# Patient Record
Sex: Female | Born: 1985 | Race: White | Hispanic: No | Marital: Married
Health system: Midwestern US, Community
[De-identification: ages and names within clinical notes are randomized; demographics above are authoritative.]

---
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
## 2019-12-18 IMAGING — MR MRI CERVICAL SPINE WITHOUT CONTRAST
7 of 8 series · 36 of 48 positions shown · non-contrast
Comparison: none

﻿

MAGNETIC RESONANCE IMAGING CERVICAL SPINE WITHOUT CONTRAST ADMINISTRATION
HISTORY: Neck pain.  Left cervical radiculopathy.
TECHNIQUE: Multiplanar magnetic resonance imaging of the cervical spine was accomplished utilizing a surface coil and an open 0.3Adela Bent scanner without intravenous contrast administration.  T1 and T2-weighted thin slice sections were obtained.

[Series 1: scano s/c · axial · 5.0mm · 1.02mm/px · z∈[-8,+130]mm · 5 of 14 slices shown]
[im 1/14]
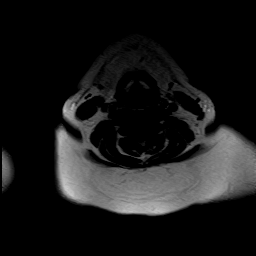
[im 4/14]
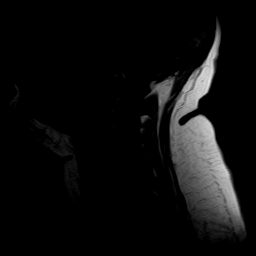
[im 7/14]
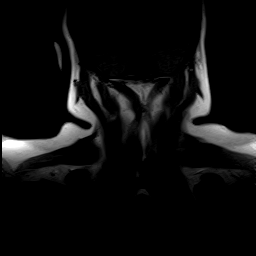
[im 10/14]
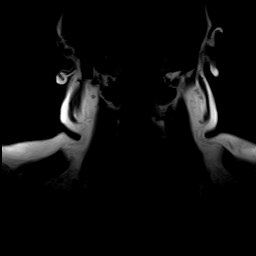
[im 14/14]
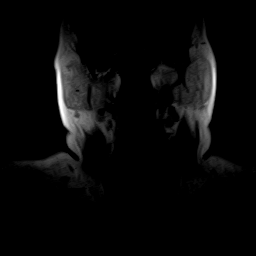

[Series 2: sag fir · sagittal · 3.0mm · 0.94mm/px · 4 of 13 slices shown]
[im 1/13]
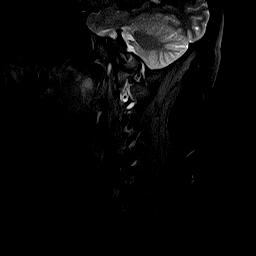
[im 5/13]
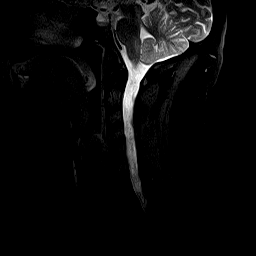
[im 9/13]
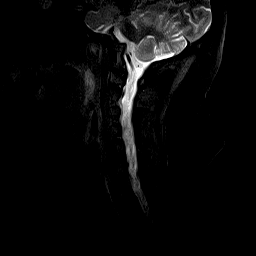
[im 13/13]
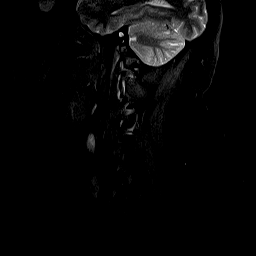

[Series 3: T1 · sagittal · 3.0mm · 0.94mm/px · 4 of 13 slices shown]
[im 1/13]
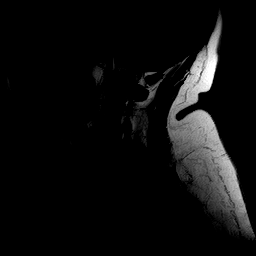
[im 5/13]
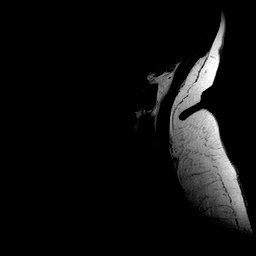
[im 9/13]
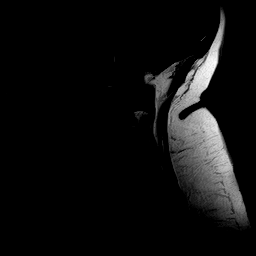
[im 13/13]
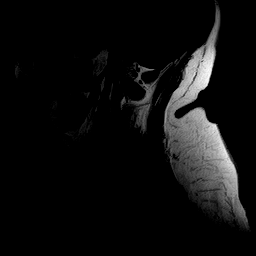

[Series 4: trs ge w/mtc · axial · 3.0mm · 0.94mm/px · z∈[-96,+4]mm · 9 of 26 slices shown]
[im 1/26]
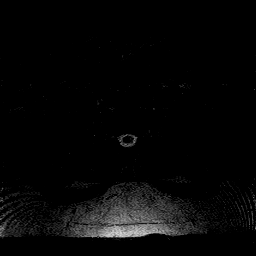
[im 4/26]
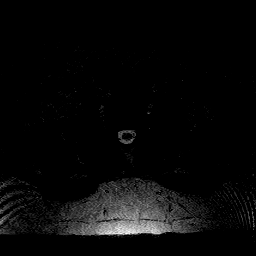
[im 7/26]
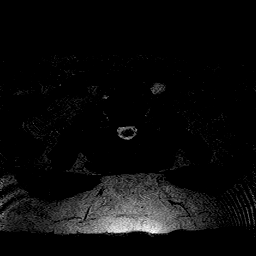
[im 10/26]
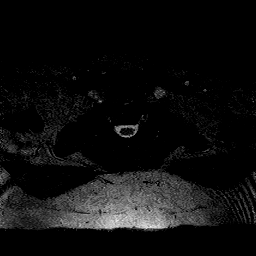
[im 13/26]
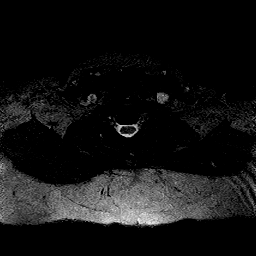
[im 16/26]
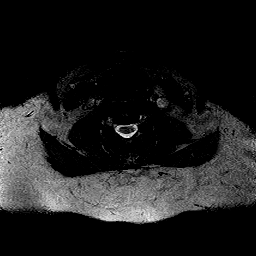
[im 19/26]
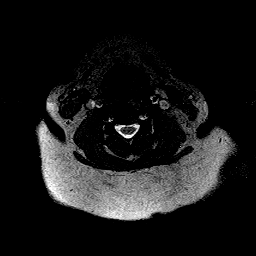
[im 22/26]
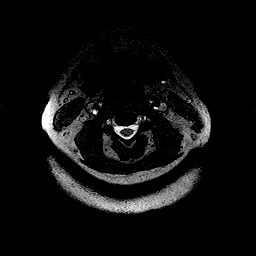
[im 26/26]
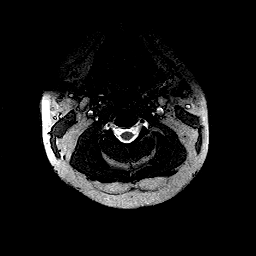

[Series 5: (id) · axial · 3.0mm · 0.94mm/px · z∈[-96,+4]mm · 9 of 26 slices shown]
[im 1/26]
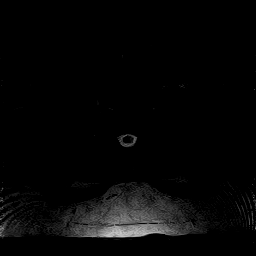
[im 4/26]
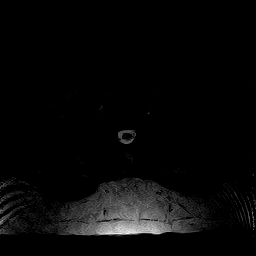
[im 7/26]
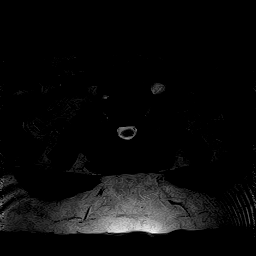
[im 10/26]
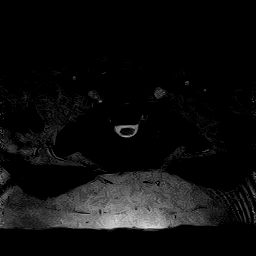
[im 13/26]
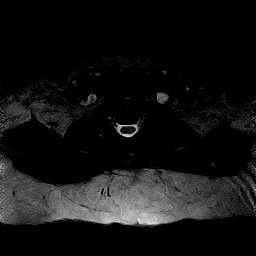
[im 16/26]
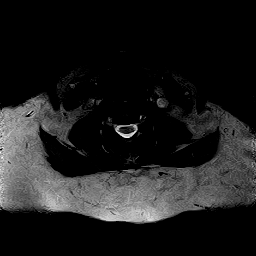
[im 19/26]
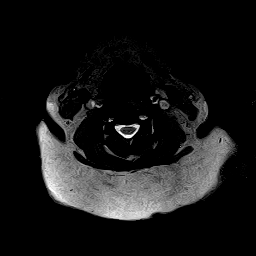
[im 22/26]
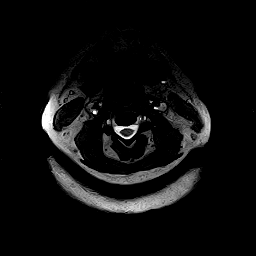
[im 26/26]
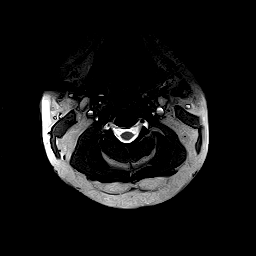

[Series 6: T2 · sagittal · 3.0mm · 0.94mm/px · 4 of 13 slices shown]
[im 1/13]
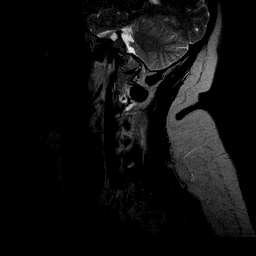
[im 5/13]
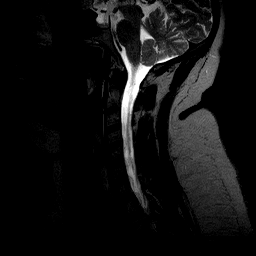
[im 9/13]
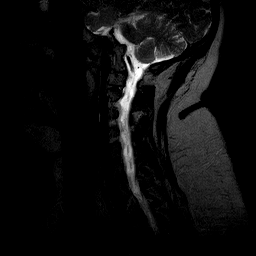
[im 13/13]
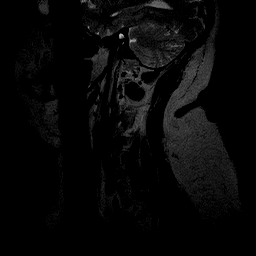

[Series 7: cor shim · coronal · 10.0mm · 4.69mm/px · 1 of 3 slices shown]
[im 1/3]
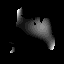

[36 of 48 positions shown; findings below may reference images not displayed]

FINDINGS: Comparison made to prior MRI cervical spine evaluation from [facility] dated 03/20/2019.

Mild cervical kyphosis and levoscoliosis again demonstrated.  Findings could be a reflection of chronic malalignment or evidence for underlying muscle spasm.  The cervical vertebrae themselves appear normal in height.  There are no significant abnormalities of regional bone marrow signal. 

The region of the foramen magnum appears normal.  I can identify no focal abnormalities of the visualized paraspinal soft tissues. 

Early cervical degenerative disc disease is again apparent with disc desiccation and minor disc space narrowing.  Minor annular bulging C5-C6 interspace.  Mild to moderate annular bulging C6-C7 interspace.  I do not see evidence for a significant-appearing focal disc herniation or extrusion.  There is no evidence for central nor lateral cervical spinal canal stenosis.  There is no evidence for neural foraminal compromise.  

When allowance is made for minor patient motion, the cervical spinal cord appears normal in caliber and signal intensity.
IMPRESSION: 1. Kyphoscoliosis.  This may be a reflection of chronic malalignment or evidence for underlying muscle spasm. 

2. Early cervical degenerative disc disease with no evidence for significant focal nuclear herniation nor spinal canal or neural foraminal compromise. 

3. Please see in-depth discussion in body of report.

## 2019-12-18 IMAGING — MR MRI JOINT UPPER EXTREMITY WITHOUT CONTRAST LT
11 series · 40 of 40 positions shown · non-contrast
Comparison: none

﻿

MAGNETIC RESONANCE IMAGING LEFT SHOULDER WITHOUT CONTRAST ADMINISTRATION
HISTORY: Left shoulder pain.  Diminished range of motion.
TECHNIQUE: Multiplanar magnetic resonance imaging of the left shoulder joint was accomplished utilizing a surface coil and an open 0.3Wela Micho scanner.  T1-weighted, intermediate intensity, and T2-weighted scanned sections obtained.

[Series 1: scano cor · coronal · left · 6.0mm · 0.94mm/px · 3 of 10 slices shown]
[im 1/10]
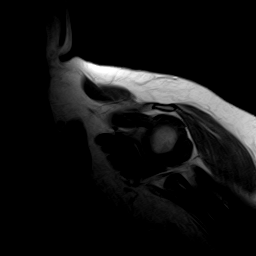
[im 5/10]
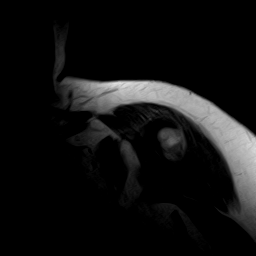
[im 10/10]
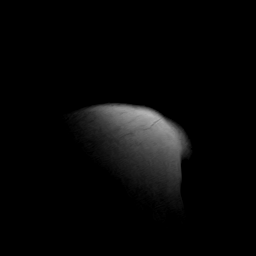

[Series 2: scano sag · sagittal · left · 4.0mm · 0.94mm/px · 1 of 7 slices shown]
[im 1/7]
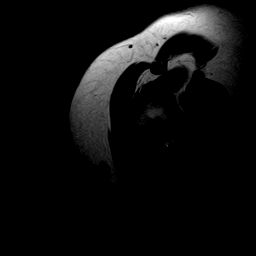

[Series 3: PD · axial · left · 4.0mm · 0.74mm/px · z∈[-61,+14]mm · 4 of 18 slices shown (1 of 2)]
[im 1/18]
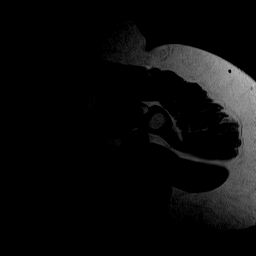
[im 6/18]
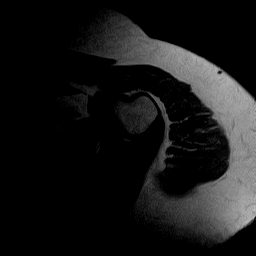
[im 12/18]
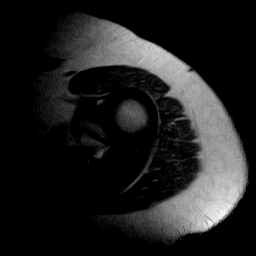
[im 18/18]
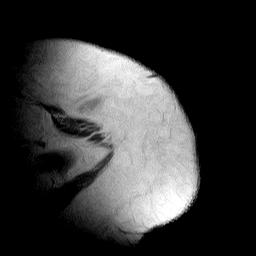

[Series 4: z ir cor · oblique · left · 4.0mm · 0.74mm/px · 3 of 16 slices shown]
[im 1/16]
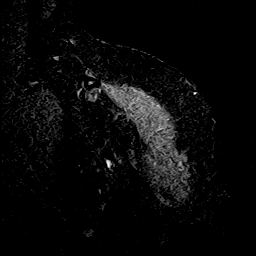
[im 8/16]
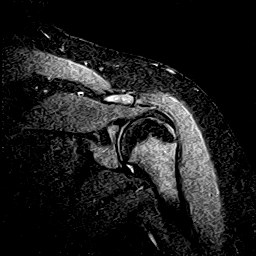
[im 16/16]
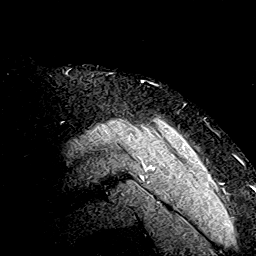

[Series 5: PD · oblique · left · 4.0mm · 0.74mm/px · 7 of 32 slices shown (2 of 2)]
[im 1/32]
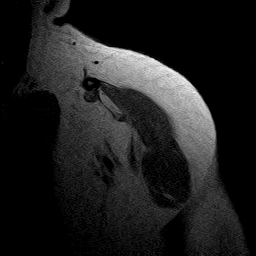
[im 6/32]
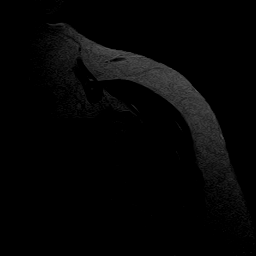
[im 11/32]
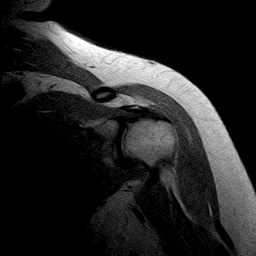
[im 16/32]
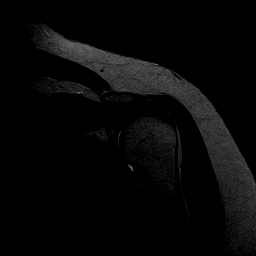
[im 21/32]
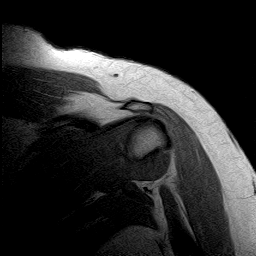
[im 26/32]
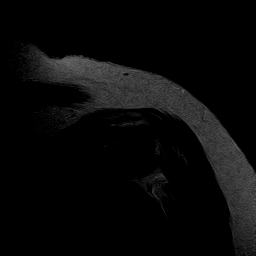
[im 32/32]
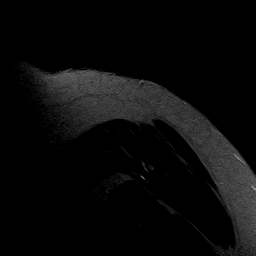

[Series 6: cor te 20 · oblique · left · 4.0mm · 0.74mm/px · 3 of 16 slices shown]
[im 1/16]
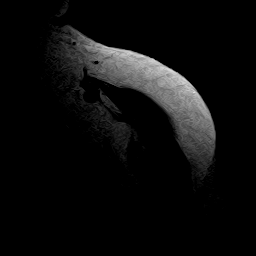
[im 8/16]
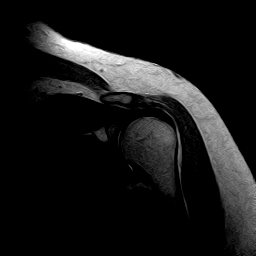
[im 16/16]
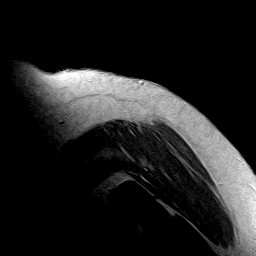

[Series 7: cor te 90 · oblique · left · 4.0mm · 0.74mm/px · 3 of 16 slices shown]
[im 1/16]
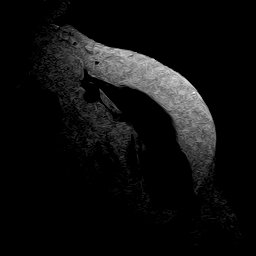
[im 8/16]
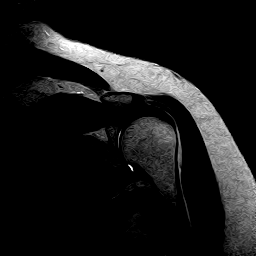
[im 16/16]
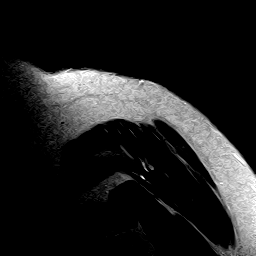

[Series 8: sag de · oblique · left · 4.0mm · 0.74mm/px · 7 of 32 slices shown]
[im 1/32]
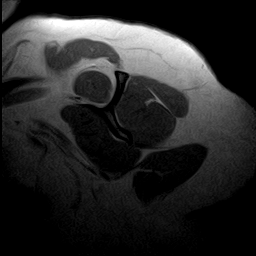
[im 6/32]
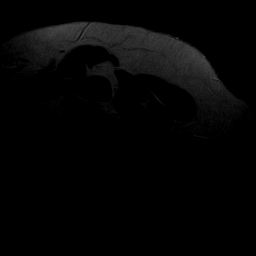
[im 11/32]
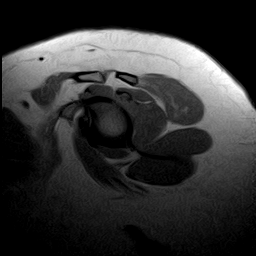
[im 16/32]
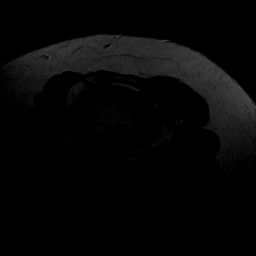
[im 21/32]
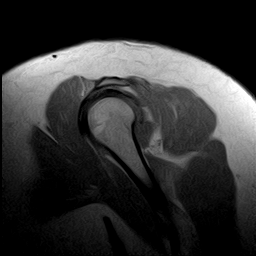
[im 26/32]
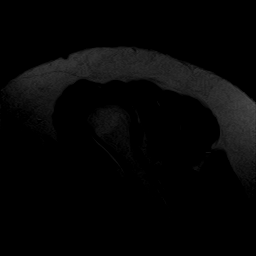
[im 32/32]
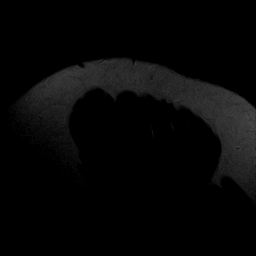

[Series 9: sag te 20 · oblique · left · 4.0mm · 0.74mm/px · 3 of 16 slices shown]
[im 1/16]
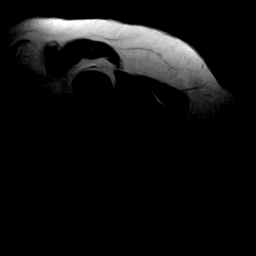
[im 8/16]
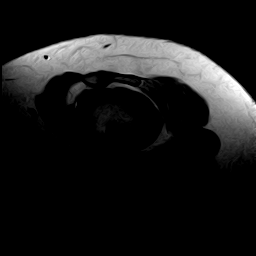
[im 16/16]
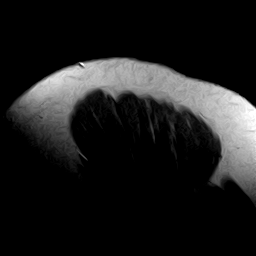

[Series 10: sag te 90 · oblique · left · 4.0mm · 0.74mm/px · 3 of 16 slices shown]
[im 1/16]
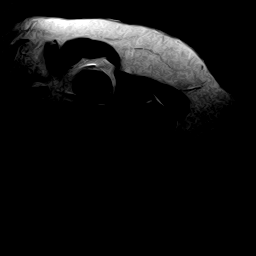
[im 8/16]
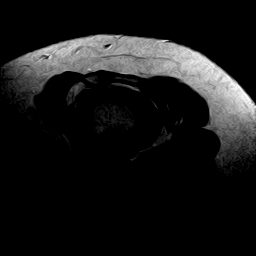
[im 16/16]
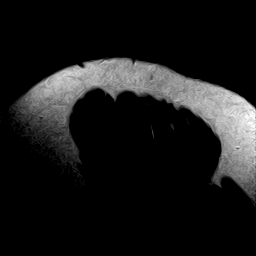

[Series 11: T1 · oblique · left · 4.0mm · 0.37mm/px · 3 of 16 slices shown]
[im 1/16]
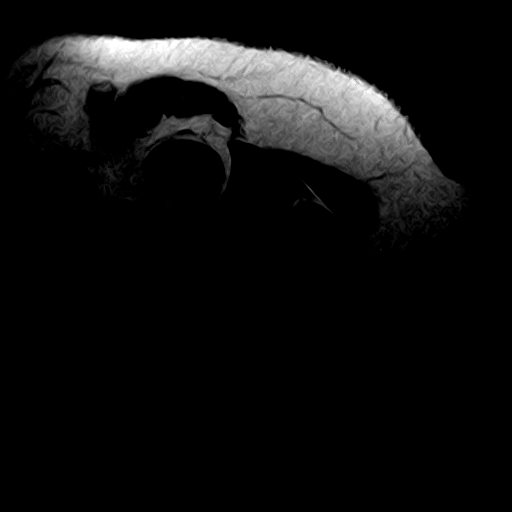
[im 8/16]
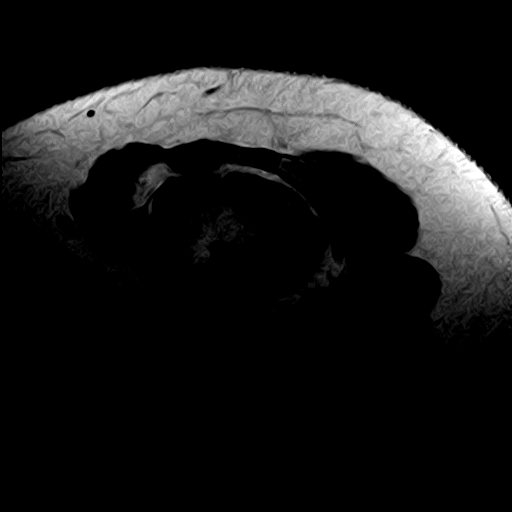
[im 16/16]
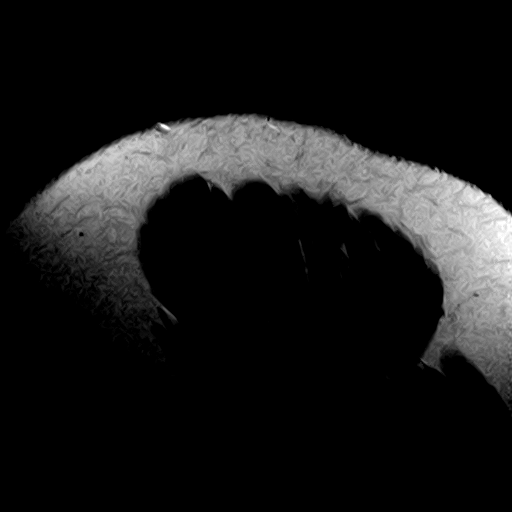

[40 of 40 positions shown; findings below may reference images not displayed]

FINDINGS: Comparison made to prior MRI left shoulder evaluation from [facility] dated 04/20/2019. 

There are no abnormalities of regional bone marrow signal intensity to suggest occult osseous trauma or fracture.  Borderline small glenohumeral joint effusion, with increased joint fluid in comparison to prior study.  No evidence for a paralabral cyst.  Biceps insertion appears intact. 

I do not identify significant acromial outlet narrowing.  There are no subacromial or subdeltoid bursal effusions.  There is no MRI evidence for a focal rotator cuff tear.
IMPRESSION: 1. Slight increase in glenohumeral joint fluid since prior study, borderline for a small glenohumeral joint effusion.  

2. No evidence for a rotator cuff tear, bursal effusion, or occult osseous trauma or a fracture.  

3. Please see in-depth discussion in body of report.

## 2020-07-25 ENCOUNTER — Ambulatory Visit: Admit: 2020-07-25 | Discharge: 2020-07-25 | Attending: Neurology

## 2020-07-25 ENCOUNTER — Ambulatory Visit: Attending: Neurology

## 2020-07-25 DIAGNOSIS — R200 Anesthesia of skin: Secondary | ICD-10-CM

## 2020-07-25 MED ORDER — GABAPENTIN 100 MG CAP
100 mg | ORAL_CAPSULE | Freq: Four times a day (QID) | ORAL | 5 refills | Status: AC
Start: 2020-07-25 — End: ?

## 2020-07-25 NOTE — Progress Notes (Signed)
07/25/2020  Emily Warner     Patient is referred by the following provider for consultation regarding as below:     NUMBNESS/WEAKNESS HANDS  emg ncv    Dear  Emily Warner, Emily Revere, MD  [address]  [place],  [place] [address]                                 NEUROLOGY     CONSULTATION                   Chief Complaint:  Paresthesia hands fingers  Right UE essential tremor.            35 year old right-handed married white female with a history of paresthesia of her hands that increases when she wakes up in the morning she has to shake them and also when she is driving or using her wrist motions frequently.  She is here for EMG and consultation regarding this.    Previous diagnosis of tremors she tried some form of beta-blocker (possibly metoprolol) with variable results.  She had a grandmother with Parkinson's.  Mostly her tremor is only in her right upper extremity.    Some hand pains.    This is a   right handed 35 y.o. Married    Caucasian female with paresthesia / weakness right /left hands -- tremors right -- +/- response prev neurologist re:: ? Beta blocker trial - though DDx pt notes was "early parkinson's tremor".   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            * I reviewed the available and pertinent records - including eHR and Care Everywhere - notes of PMHx, PSHx, Fam Hx, and  and have examined patient with the following findings:   [facility] -- >>  And proficient not avail presently.       IMAGING REVIEW:  I REVIEWED PERTINENT  IMAGES AND REPORTS WITH THE PATIENT PERSONALLY, DIRECTLY AND FULLY.  13 extra  MINUTES.  She had some scanning procedures elsewhere but she does not have any data in regards.  Since patient had previous imaging I recommended that she obtain CD-ROM of those testing so that if she gets a repeat testing such as MRI they have a comparison they can do with the imaging previous.      Past Medical History:  Previous  neurology examination for tremors.  Treated with a beta-blocker.    Past Surgical History:  Negative.    Social History:  Social History     Socioeconomic History   ??? Marital status: MARRIED     Spouse name: Not on file   ??? Number of children: Not on file   ??? Years of education: Not on file   ??? Highest education level: Not on file   Occupational History   ??? Not on file   Tobacco Use   ??? Smoking status: Not on file   ??? Smokeless tobacco: Not on file   Substance and Sexual Activity   ??? Alcohol use: Not on file   ??? Drug use: Not on file   ??? Sexual activity: Not on file   Other Topics Concern   ??? Not on file   Social History Narrative   ??? Not on file     Social Determinants of Health     Financial Resource Strain:    ???  Difficulty of Paying Living Expenses: Not on file   Food Insecurity:    ??? Worried About Running Out of Food in the Last Year: Not on file   ??? Ran Out of Food in the Last Year: Not on file   Transportation Needs:    ??? Lack of Transportation (Medical): Not on file   ??? Lack of Transportation (Non-Medical): Not on file   Physical Activity:    ??? Days of Exercise per Week: Not on file   ??? Minutes of Exercise per Session: Not on file   Stress:    ??? Feeling of Stress : Not on file   Social Connections:    ??? Frequency of Communication with Friends and Family: Not on file   ??? Frequency of Social Gatherings with Friends and Family: Not on file   ??? Attends Religious Services: Not on file   ??? Active Member of Clubs or Organizations: Not on file   ??? Attends Banker Meetings: Not on file   ??? Marital Status: Not on file   Intimate Partner Violence:    ??? Fear of Current or Ex-Partner: Not on file   ??? Emotionally Abused: Not on file   ??? Physically Abused: Not on file   ??? Sexually Abused: Not on file   Housing Stability:    ??? Unable to Pay for Housing in the Last Year: Not on file   ??? Number of Places Lived in the Last Year: Not on file   ??? Unstable Housing in the Last Year: Not on file       Family History:    No family history on file.    Medications:      Current Outpatient Medications:   ???  gabapentin (NEURONTIN) 100 mg capsule, Take 1 Capsule by mouth four (4) times daily. Indications: essential tremor, Disp: 120 Capsule, Rfl: 5      Not on File    Review of Systems:  Review of Systems   Constitutional: Negative.    HENT: Negative.    Eyes: Negative.    Respiratory: Negative.    Cardiovascular: Negative.    Musculoskeletal: Negative.    Skin: Negative.    Neurological: Positive for tingling, tremors, sensory change and focal weakness. Negative for dizziness, speech change, seizures, loss of consciousness, weakness and headaches.   Psychiatric/Behavioral: Negative.    All other systems reviewed and are negative.    No flowsheet data found.  No flowsheet data found.     Extended / Orthostatic Vitals:    Vital Signs  Pulse (Heart Rate): 74 (07/25/20 1125)  BP: 112/86 (07/25/20 1125)  MAP (Calculated): 95 (07/25/20 1125)  BP 1 Location: Left upper arm (07/25/20 1125)              Vitals:    07/25/20 1125   BP: 112/86   Pulse: 74   Weight: 247 lb (112 kg)   Height:  (1.626 m)        Physical Exam  Vitals reviewed.   Constitutional:       General: She is awake. She is not in acute distress.     Appearance: She is well-developed and well-groomed. She is not ill-appearing, toxic-appearing or diaphoretic.   HENT:      Head: Normocephalic and atraumatic. No raccoon eyes, abrasion, contusion, right periorbital erythema or left periorbital erythema.      Right Ear: Hearing normal.      Left Ear: Hearing normal.   Eyes:  General: Lids are normal. Vision grossly intact. No visual field deficit or scleral icterus.        Right eye: No discharge.         Left eye: No discharge.      Extraocular Movements: Extraocular movements intact.      Right eye: Normal extraocular motion and no nystagmus.      Left eye: Normal extraocular motion and no nystagmus.      Conjunctiva/sclera: Conjunctivae normal.      Right eye: Right  conjunctiva is not injected.      Left eye: Left conjunctiva is not injected.      Pupils: Pupils are equal, round, and reactive to light.   Neck:      Trachea: Phonation normal.   Pulmonary:      Effort: Pulmonary effort is normal. No respiratory distress.      Breath sounds: No wheezing.   Musculoskeletal:         General: No swelling, tenderness, deformity or signs of injury. Normal range of motion.      Cervical back: Normal range of motion. No rigidity or torticollis. Normal range of motion.      Right lower leg: No edema.      Left lower leg: No edema.      Comments: Tinel's pos.     Skin:     General: Skin is warm and dry.      Capillary Refill: Capillary refill takes less than 2 seconds.      Coloration: Skin is not cyanotic, jaundiced or pale.      Findings: No bruising, erythema, lesion or rash.      Nails: There is no clubbing.   Neurological:      Mental Status: She is alert and easily aroused. Mental status is at baseline.      Cranial Nerves: Cranial nerves are intact. No cranial nerve deficit, dysarthria or facial asymmetry.      Sensory: Sensory deficit (Sensory decrease in the fingers on the right hand.  Mild.) present.      Motor: Tremor (This is an action tremor.  No resting tremor.  It only barely is visible in her handwriting which a meter make a sample of with dated on it as well so that she can refer to that in the future.) present. No weakness, atrophy, abnormal muscle tone, seizure activity or pronator drift.      Coordination: Coordination is intact. Romberg sign negative. Coordination normal. Finger-Nose-Finger Test normal.      Gait: Gait is intact. Gait normal.      Deep Tendon Reflexes: Strength normal. Reflexes normal.      Reflex Scores:       Tricep reflexes are 2+ on the right side and 2+ on the left side.       Bicep reflexes are 2+ on the right side and 2+ on the left side.       Brachioradialis reflexes are 2+ on the right side and 2+ on the left side.       Patellar reflexes are  2+ on the right side and 2+ on the left side.       Achilles reflexes are 2+ on the right side and 2+ on the left side.     Comments: PERRLA.  No Horner syndrome.  No glabellar.  No rigidity.  There is a action tremor right upper extremity relatively only unilateral.  No definitive rigidity.  No spasticity clonus or myoclonus.  There is  Tinel's sign Phalen sign flick sign bilateral but mostly just right-handed at the wrist.  No atrophy.  No fasciculations.    No Lhermitte sign.  No glabellar.  No Spurling.   The Hoffman on the right seems to be fleeting.  No fasciculations.  No atrophy.   Psychiatric:         Attention and Perception: Attention normal.         Mood and Affect: Mood normal.         Speech: Speech normal.         Behavior: Behavior normal. Behavior is cooperative.         Neurologic Exam     Mental Status   Attention: normal. Concentration: normal.   Speech: speech is normal   Level of consciousness: alert  Knowledge: good. Able to perform simple calculations.   Normal comprehension.     Cranial Nerves   Cranial nerves II through XII intact.     CN III, IV, VI   Pupils are equal, round, and reactive to light.    Motor Exam   Muscle bulk: normal  Overall muscle tone: normal    Strength   Strength 5/5 throughout.     Sensory Exam   Right arm light touch: decreased from fingers  Left arm light touch: decreased from fingers  Right leg vibration: decreased from toes  Left leg vibration: decreased from toes  Right arm pinprick: decreased from fingers  Left arm pinprick: decreased from fingers    Gait, Coordination, and Reflexes     Gait  Gait: normal    Coordination   Finger to nose coordination: normal    Tremor   Resting tremor: absent  Intention tremor: absent  Action tremor: absent    Reflexes   Right brachioradialis: 2+  Left brachioradialis: 2+  Right biceps: 2+  Left biceps: 2+  Right triceps: 2+  Left triceps: 2+  Right patellar: 2+  Left patellar: 2+  Right achilles: 2+  Left achilles: 2+  Right  Hoffman: absent  Left Hoffman: absent  There is no tic, twitch, tonic or clonic activity noted.     No atrophy.    Assessment   Assessment / Plan:    Diagnoses and all orders for this visit:    1. Numbness and tingling in both hands  -     MOTOR &/SENS 13/> NRV CNDJ PRECONF ELTRODE LIMB  -     NEEDLE EMG EA EXTREMITY W/PARASPINL AREA LIMITED    2. Carpal tunnel syndrome, bilateral  -     MOTOR &/SENS 13/> NRV CNDJ PRECONF ELTRODE LIMB  -     NEEDLE EMG EA EXTREMITY W/PARASPINL AREA LIMITED    3. Right hand pain  -     MOTOR &/SENS 13/> NRV CNDJ PRECONF ELTRODE LIMB  -     NEEDLE EMG EA EXTREMITY W/PARASPINL AREA LIMITED    4. Tremor, essential  -     gabapentin (NEURONTIN) 100 mg capsule; Take 1 Capsule by mouth four (4) times daily. Indications: essential tremor    5. Encounter for medication management    1.  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      (see EMG report).         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  Left is early == conservative therapy splinting and exercises.   [[ pt made aware. ]].    2.  No other definitive  neurologic phenomena.  The decreased sensory at the toes might need to be followed for any future abnormalities.  3.    Referral to Orthopedics for CTS release on right is best.      I will see her back in about 6 months to review response of the tremor to gabapentin.    The Diagnosis and differential diagnostic considerations, and Rx Tx were reviewed with the patient at length.        I have spent greater than 50% of visit discussing and counseling of patient 45 min visit for treatment and diagnostic plan review.   More than 50% of this visit  time was spent in counseling and care coordination.  The above time includes pre-  and post- face-face time in records review, and preparation including available pertinent images and reports.      Notes: Patient is to continue all medications as directed by prescribing physicians. Continuations on  today's visit are made based on the patient's report of current medications.   Patient acknowledges the above examination and reviews.          Current Meds Verified: Current meds/immunizations reviewed, including purpose with pt. Med Recon list given to pt/family. Pt advised to discard old med lists and provide all providers with current list at each visit and carry list with them in case of emergency.        [ *NOTE:  parts or all of this consultation are produced using artificial voice recognition software.  Some speech errors are inherent in such software and may be included in the produced record. ]        Cameron Proud MD  Consultation Neurology, Neurodiagnostics and Neurotherapeutics  Neuroelectrophysiology, EEG, EMG  [facility]  [address] / [address]    [place], [place] [address]  Phone:  [phone]  Fax:   [phone]

## 2020-07-25 NOTE — Progress Notes (Signed)
EMG/Nerve Conduction Study Procedure Note  [address]   [place], [place]  [address]    [phone]      Hx:    Exam:     35 y.o. RH female referred for EMG/NCV of the RIGHT upper extremity for numbness, tingling and pain, r/o CTS vs ulnar neuropathy. No hx of DM or thyroid disease.          Summary               needle EMG of selected muscles as below.  Conduction velocity testing bilateral upper extremities.  1.      Controlled environmental factors / EMG lab.  Temperature.   2. NCV : sensory segments:    Abnormal = the right median SCV is markedly slow with attenuated SNAP amplitudes; the left median is slowed mildly to moderately.  Bilateral ulnar bilateral radial are normal SCV SNAP.  3. NCV transcarpal sensory segments: Abnormal significantly = the right median transcarpal SCV is very markedly slowed with attenuation of the SNAP amplitudes and prolonged peak difference of 2.10 ms the upper limit of normal at 0.20 ms; the left median also abnormal slow with a normal ulnar transcarpal bilaterally in the left median peak difference latencies is 1.00 ms upper limit at 0.20 with minimal attenuation.     4. NCV Motor MCV segments:     Abnormal = right median???APB MCV terminal motor latency is 6.29 ms with the upper limit at 4.15 ms and there is moderate attenuation of the CMAP amplitudes.  Left median MCV CMAP = moderate attenuation CMAP only there is no definite prolonged terminal latency.  Bilateral ulnar MCV CMAP normal.  5. F-wave studies:       Abnormal  = = right median to APB F waves are significantly prolonged.  Normal right ulnar left median F waves.  6. H-REFLEX Studies:    Deferred.  7.  NEEDLE EMG:   Tested muscles::    FDI APB ADM bilaterally = normal   Normal insertional activity and interference pattern/recruitment.  No fasciculations fibrillations positive sharp waves.  Normal MUP.  No BSS AP.  No giant MUP.  No myotonia.  No upper motor neuron sign.        INTERPRETATION:            THESE FINDINGS ARE ELECTROPHYSIOLOGIC EVIDENCE OF MODERATELY SEVERE RIGHT MEDIAN ENTRAPMENT AT THE WRIST AND THE CARPAL SEGMENT WITH MILD EARLY LEFT MEDIAN NERVE ENTRAPMENT.  NO OTHER NEUROPATHY.  NO MYOPATHY MYOTONIA OR FASCICULATIONS.  NO RADICULOPATHY PLEXOPATHY.      CONCLUSION:      Compatible with significant/severe carpal tunnel on the right and early carpal tunnel on the left no other neuropathy.      Procedure Details:       Correlates clinically and examination with features of significant moderately severe carpal tunnel syndrome on the right with involvement of the motor segments as well; the left carpal tunnel syndrome is very early.     likely surgical category of the right carpal tunnel syndrome.    Conservative therapy measures might be taken.  These were reviewed with patient who is aware of the above.                  Please Note::     Data and waveforms * filed under Procedure category [facility].    See Procedure Files for complete data pages.            Emily Warner  Aleda Grana MD  Consultation Neurology, Neurodiagnostics and Neurotherapeutics  Neuroelectrophysiology, EEG, EMG  [facility]  [address]  [address]    [place] [address]  Phone:  [phone]  Fax:   [phone]      + + +   Glossary:   MUP: motor unit potential;  SNAP: sensory nerve action potential; Fibs:  Fibrillations;  Fascic: fasciculations; IA: insertional activity;  IP: interference pattern;  SCV :  Sensory conduction velocity;  MCV: motor conduction velocity; NOTE:: muscles are abbreviated latin initials.     [place] raw datafile::   * filed at Procedure or [employer]. *

## 2020-07-29 NOTE — Telephone Encounter (Signed)
Patient informed.

## 2020-07-29 NOTE — Telephone Encounter (Signed)
Gabapentin start 100 mg   Take one after supper daily x 4 days     One after supper and 1 at bedtime x 4 days      Then - 1 at supper and 2 at bedtime x 4 days...       Then 2 at supper and 2 at bedtime after that = as tolerated.

## 2020-07-29 NOTE — Telephone Encounter (Signed)
Patient called requesting clarification on the dosage for her gabapentin 100 mg, states that Dr. Janee Morn mentioned something about her dosage being different than what is on the bottle, his notes do not specify.

## 2020-07-29 NOTE — Telephone Encounter (Signed)
Gabapentin 100mg  1 tab QID was sent into pharmacy. I do not see in your note that you wanted patient to take differently. Please advise.

## 2021-01-26 ENCOUNTER — Encounter: Attending: Neurology
